# Patient Record
Sex: Female | Born: 2000 | Race: White | Hispanic: No | Marital: Married | Smoking: Never smoker
Health system: Southern US, Community
[De-identification: ages and names within clinical notes are randomized; demographics above are authoritative.]

## PROBLEM LIST (undated history)

## (undated) ENCOUNTER — Ambulatory Visit: Admission: EM | Disposition: A | Discharge status: Home / Self Care | Payer: Commercial Managed Care - PPO

## (undated) DIAGNOSIS — Z789 Other specified health status: Secondary | ICD-10-CM

## (undated) HISTORY — PX: KNEE SURGERY: SHX244

---
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
## 2022-01-05 LAB — OB RESULTS CONSOLE GBS: GBS: NEGATIVE

## 2022-01-06 ENCOUNTER — Encounter (HOSPITAL_COMMUNITY): Payer: Self-pay | Admitting: *Deleted

## 2022-01-06 ENCOUNTER — Telehealth (HOSPITAL_COMMUNITY): Payer: Self-pay | Admitting: *Deleted

## 2022-01-06 NOTE — Telephone Encounter (Signed)
Preadmission screen  

## 2022-01-12 ENCOUNTER — Inpatient Hospital Stay (HOSPITAL_COMMUNITY)
Admission: AD | Admit: 2022-01-12 | Discharge: 2022-01-12 | Disposition: A | Discharge status: Home / Self Care | Payer: BC Managed Care – PPO | Attending: Obstetrics and Gynecology | Admitting: Obstetrics and Gynecology

## 2022-01-12 ENCOUNTER — Encounter (HOSPITAL_COMMUNITY): Payer: Self-pay | Admitting: Obstetrics and Gynecology

## 2022-01-12 DIAGNOSIS — O163 Unspecified maternal hypertension, third trimester: Secondary | ICD-10-CM

## 2022-01-12 DIAGNOSIS — O133 Gestational [pregnancy-induced] hypertension without significant proteinuria, third trimester: Secondary | ICD-10-CM | POA: Diagnosis present

## 2022-01-12 DIAGNOSIS — M7989 Other specified soft tissue disorders: Secondary | ICD-10-CM | POA: Insufficient documentation

## 2022-01-12 DIAGNOSIS — Z3A37 37 weeks gestation of pregnancy: Secondary | ICD-10-CM | POA: Diagnosis not present

## 2022-01-12 LAB — COMPREHENSIVE METABOLIC PANEL
ALT: 20 U/L (ref 0–44)
AST: 20 U/L (ref 15–41)
Albumin: 3 g/dL — ABNORMAL LOW (ref 3.5–5.0)
Alkaline Phosphatase: 111 U/L (ref 38–126)
Anion gap: 8 (ref 5–15)
BUN: 7 mg/dL (ref 6–20)
CO2: 24 mmol/L (ref 22–32)
Calcium: 9.2 mg/dL (ref 8.9–10.3)
Chloride: 104 mmol/L (ref 98–111)
Creatinine, Ser: 0.66 mg/dL (ref 0.44–1.00)
GFR, Estimated: >60 mL/min (ref >60)
Glucose, Bld: 94 mg/dL (ref 70–99)
Potassium: 3.7 mmol/L (ref 3.5–5.1)
Sodium: 136 mmol/L (ref 135–145)
Total Bilirubin: 0.3 mg/dL (ref 0.3–1.2)
Total Protein: 6.5 g/dL (ref 6.5–8.1)

## 2022-01-12 LAB — CBC WITH DIFFERENTIAL/PLATELET
Abs Immature Granulocytes: 0.05 10*3/uL (ref 0.00–0.07)
Basophils Absolute: 0 10*3/uL (ref 0.0–0.1)
Basophils Relative: 0 %
Eosinophils Absolute: 0.1 10*3/uL (ref 0.0–0.5)
Eosinophils Relative: 1 %
HCT: 34.5 % — ABNORMAL LOW (ref 36.0–46.0)
Hemoglobin: 11.8 g/dL — ABNORMAL LOW (ref 12.0–15.0)
Immature Granulocytes: 1 %
Lymphocytes Relative: 21 %
Lymphs Abs: 1.9 10*3/uL (ref 0.7–4.0)
MCH: 30.2 pg (ref 26.0–34.0)
MCHC: 34.2 g/dL (ref 30.0–36.0)
MCV: 88.2 fL (ref 80.0–100.0)
Monocytes Absolute: 0.5 10*3/uL (ref 0.1–1.0)
Monocytes Relative: 5 %
Neutro Abs: 6.4 10*3/uL (ref 1.7–7.7)
Neutrophils Relative %: 72 %
Platelets: 240 10*3/uL (ref 150–400)
RBC: 3.91 MIL/uL (ref 3.87–5.11)
RDW: 12.5 % (ref 11.5–15.5)
WBC: 8.9 10*3/uL (ref 4.0–10.5)
nRBC: 0 % (ref 0.0–0.2)

## 2022-01-12 LAB — PROTEIN / CREATININE RATIO, URINE
Creatinine, Urine: 126.59 mg/dL
Protein Creatinine Ratio: 0.06 mg/mg{Cre} (ref 0.00–0.15)
Total Protein, Urine: 8 mg/dL

## 2022-01-12 MED ORDER — LABETALOL HCL 5 MG/ML IV SOLN: 80.0000 mg | INTRAVENOUS | Status: DC | PRN

## 2022-01-12 MED ORDER — HYDRALAZINE HCL 20 MG/ML IJ SOLN: 10.0000 mg | INTRAMUSCULAR | Status: DC | PRN

## 2022-01-12 MED ORDER — LABETALOL HCL 5 MG/ML IV SOLN: 40.0000 mg | INTRAVENOUS | Status: DC | PRN

## 2022-01-12 MED ORDER — LABETALOL HCL 5 MG/ML IV SOLN: 20.0000 mg | INTRAVENOUS | Status: DC | PRN

## 2022-01-12 NOTE — MAU Provider Note (Signed)
History     CSN: [id number]  Arrival date and time: 01/12/22 1425   Event Date/Time   First Provider Initiated Contact with Patient 01/12/22 1523      Chief Complaint  Patient presents with   Hypertension   Andrea Becker 21 y.o. G1P0000 at 1265w6d presents to MAU following elevated BP's in the office today. BP in the office today 142/92. Pt denies any personal or family hx of hypertension. Pt states that she has had "mild headaches" over the course of the weekend that subsided spontaneously, but denies a headache today.  She endorses increased leg swelling since Sunday that does not resolve with rest and elevation. Denies epigastric pain. SVE in office today per pt was 1cm.  Denies VB, LOF, and contractions. She endorses +FM.  Has elective induction scheduled for 01/20/22.       OB History     Gravida  1   Para  0   Term  0   Preterm  0   AB  0   Living  0      SAB  0   IAB      Ectopic  0   Multiple  0   Live Births  0           Past Medical History:  Diagnosis Date   Medical history non-contributory     Past Surgical History:  Procedure Laterality Date   KNEE SURGERY      History reviewed. No pertinent family history.  Social History   Tobacco Use   Smoking status: Never   Smokeless tobacco: Never  Vaping Use   Vaping Use: Never used  Substance Use Topics   Alcohol use: Never   Drug use: Never    Allergies: No Known Allergies  Medications Prior to Admission  Medication Sig Dispense Refill Last Dose   doxylamine, Sleep, (UNISOM) 25 MG tablet Take 25 mg by mouth at bedtime as needed.      famotidine (PEPCID) 20 MG tablet Take 1 tablet (20 mg total) by mouth 2 (two) times daily. 30 tablet 4    pyridOXINE (VITAMIN B-6) 50 MG tablet Take 50 mg by mouth daily.       Review of Systems  Constitutional:  Negative for activity change, appetite change and fever.  Respiratory:  Negative for apnea and shortness of breath.    Cardiovascular:  Positive for leg swelling. Negative for chest pain.  Gastrointestinal:  Negative for abdominal pain.  Genitourinary:  Negative for vaginal bleeding, vaginal discharge and vaginal pain.  Neurological:  Positive for headaches. Negative for dizziness, weakness and numbness.  Physical Exam   Blood pressure (!) 143/91, pulse 96, temperature 99 F (37.2 C), temperature source Oral, resp. rate 20, height 5\' 5"  (1.651 m), weight 101.9 kg, SpO2 98 %.  Physical Exam Vitals and nursing note reviewed.  Constitutional:      General: She is not in acute distress.    Appearance: Normal appearance.  HENT:     Head: Normocephalic.  Cardiovascular:     Rate and Rhythm: Normal rate.  Pulmonary:     Effort: Pulmonary effort is normal. No respiratory distress.     Breath sounds: Normal breath sounds.  Abdominal:     Tenderness: There is no abdominal tenderness.     Comments: Pregnant   Musculoskeletal:        General: Swelling present. Normal range of motion.     Cervical back: Normal range of motion.  Comments: Bilateral mild pitting edema noted on lower extremities    Skin:    General: Skin is warm and dry.     Capillary Refill: Capillary refill takes less than 2 seconds.  Neurological:     Mental Status: She is alert and oriented to person, place, and time.  Psychiatric:        Mood and Affect: Mood normal.   FHT: 135 moderate variability with accels presents. No decels noted. Category I   MAU Course  Procedures Lab Orders         OB RESULT CONSOLE Group B Strep         Protein / creatinine ratio, urine         Comprehensive metabolic panel         CBC with Differential/Platelet    Prenatal records reviewed, no prior elevated BPs  noted.   Results for orders placed or performed during the hospital encounter of 01/12/22 (from the past 24 hour(s))  Protein / creatinine ratio, urine     Status: None   Collection Time: 01/12/22  2:48 PM  Result Value Ref Range    Creatinine, Urine 126.59 mg/dL   Total Protein, Urine 8 mg/dL   Protein Creatinine Ratio 0.06 0.00 - 0.15 mg/mg[Cre]  Comprehensive metabolic panel     Status: Abnormal   Collection Time: 01/12/22  3:32 PM  Result Value Ref Range   Sodium 136 135 - 145 mmol/L   Potassium 3.7 3.5 - 5.1 mmol/L   Chloride 104 98 - 111 mmol/L   CO2 24 22 - 32 mmol/L   Glucose, Bld 94 70 - 99 mg/dL   BUN 7 6 - 20 mg/dL   Creatinine, Ser 6.81 0.44 - 1.00 mg/dL   Calcium 9.2 8.9 - 15.7 mg/dL   Total Protein 6.5 6.5 - 8.1 g/dL   Albumin 3.0 (L) 3.5 - 5.0 g/dL   AST 20 15 - 41 U/L   ALT 20 0 - 44 U/L   Alkaline Phosphatase 111 38 - 126 U/L   Total Bilirubin 0.3 0.3 - 1.2 mg/dL   GFR, Estimated >26 >20 mL/min   Anion gap 8 5 - 15  CBC with Differential/Platelet     Status: Abnormal   Collection Time: 01/12/22  3:32 PM  Result Value Ref Range   WBC 8.9 4.0 - 10.5 K/uL   RBC 3.91 3.87 - 5.11 MIL/uL   Hemoglobin 11.8 (L) 12.0 - 15.0 g/dL   HCT 35.5 (L) 97.4 - 16.3 %   MCV 88.2 80.0 - 100.0 fL   MCH 30.2 26.0 - 34.0 pg   MCHC 34.2 30.0 - 36.0 g/dL   RDW 84.5 36.4 - 68.0 %   Platelets 240 150 - 400 K/uL   nRBC 0.0 0.0 - 0.2 %   Neutrophils Relative % 72 %   Neutro Abs 6.4 1.7 - 7.7 K/uL   Lymphocytes Relative 21 %   Lymphs Abs 1.9 0.7 - 4.0 K/uL   Monocytes Relative 5 %   Monocytes Absolute 0.5 0.1 - 1.0 K/uL   Eosinophils Relative 1 %   Eosinophils Absolute 0.1 0.0 - 0.5 K/uL   Basophils Relative 0 %   Basophils Absolute 0.0 0.0 - 0.1 K/uL   Immature Granulocytes 1 %   Abs Immature Granulocytes 0.05 0.00 - 0.07 K/uL    Patient Vitals for the past 24 hrs:  BP Temp Temp src Pulse Resp SpO2 Height Weight  01/12/22 1605 -- -- -- -- -- 98 % -- --  01/12/22 1600 127/83 -- -- 92 -- 98 % -- --  01/12/22 1555 -- -- -- -- -- 99 % -- --  01/12/22 1550 -- -- -- -- -- 99 % -- --  01/12/22 1545 (!) 140/94 -- -- (!) 102 -- 99 % -- --  01/12/22 1540 -- -- -- -- -- 99 % -- --  01/12/22 1535 -- -- -- -- -- 99  % -- --  01/12/22 1530 (!) 152/100 -- -- 98 20 99 % -- --  01/12/22 1525 -- -- -- -- -- 98 % -- --  01/12/22 1520 -- -- -- -- -- 98 % -- --  01/12/22 1515 136/86 -- -- 92 -- 99 % -- --  01/12/22 1510 -- -- -- -- -- 99 % -- --  01/12/22 1505 -- -- -- -- -- 98 % -- --  01/12/22 1502 (!) 143/91 -- -- 96 20 98 % -- --  01/12/22 1455 -- -- -- -- -- 98 % -- --  01/12/22 1454 (!) 142/92 -- -- (!) 109 18 100 % -- --  01/12/22 1440 (!) 149/88 99 F (37.2 C) Oral 99 17 100 % 5\' 5"  (1.651 m) 101.9 kg     MDM BPs decreased without intervention. Low Suspicion for Pre Eclampsia with normal labs and no other features. Report given to  Dr. for follow up blood pressure check in the office for Thursday or Friday of this week. Dx: Gestational hypertension    Assessment and Plan  Gestational Hypertension  - Plan for follow up with OB/GYN in the office this week.  - Pt discharged home in stable condition.  FHT Category I  - Routine labor precautions reviewed    Saturday, CNM  01/12/2022, 3:23 PM

## 2022-01-12 NOTE — MAU Note (Signed)
....  Andrea Becker is a 21 y.o. at [redacted]w[redacted]d here in MAU reporting: Two elevated BP's in office today with [facility] so she was sent here to be evaluated. Denies pain. Denies HA, visual disturbances, edema, and RUQ/epigastric pain. No VB or LOF. +FM.  Pain score: Denies pain.  FHT: 132 initial external Lab orders placed from triage: UA

## 2022-01-16 ENCOUNTER — Encounter (HOSPITAL_COMMUNITY): Payer: Self-pay | Admitting: Obstetrics and Gynecology

## 2022-01-16 ENCOUNTER — Other Ambulatory Visit: Payer: Self-pay

## 2022-01-16 ENCOUNTER — Inpatient Hospital Stay (HOSPITAL_COMMUNITY)
Admission: AD | Admit: 2022-01-16 | Discharge: 2022-01-19 | DRG: 807 | Disposition: A | Discharge status: Home / Self Care | Payer: BC Managed Care – PPO | Attending: Obstetrics and Gynecology | Admitting: Obstetrics and Gynecology

## 2022-01-16 DIAGNOSIS — R030 Elevated blood-pressure reading, without diagnosis of hypertension: Secondary | ICD-10-CM | POA: Diagnosis present

## 2022-01-16 DIAGNOSIS — O134 Gestational [pregnancy-induced] hypertension without significant proteinuria, complicating childbirth: Secondary | ICD-10-CM | POA: Diagnosis present

## 2022-01-16 DIAGNOSIS — Z3A38 38 weeks gestation of pregnancy: Secondary | ICD-10-CM | POA: Diagnosis not present

## 2022-01-16 DIAGNOSIS — O133 Gestational [pregnancy-induced] hypertension without significant proteinuria, third trimester: Secondary | ICD-10-CM | POA: Diagnosis not present

## 2022-01-16 DIAGNOSIS — O169 Unspecified maternal hypertension, unspecified trimester: Secondary | ICD-10-CM | POA: Diagnosis present

## 2022-01-16 LAB — CBC
HCT: 34.1 % — ABNORMAL LOW (ref 36.0–46.0)
Hemoglobin: 11.5 g/dL — ABNORMAL LOW (ref 12.0–15.0)
MCH: 29.9 pg (ref 26.0–34.0)
MCHC: 33.7 g/dL (ref 30.0–36.0)
MCV: 88.6 fL (ref 80.0–100.0)
Platelets: 231 10*3/uL (ref 150–400)
RBC: 3.85 MIL/uL — ABNORMAL LOW (ref 3.87–5.11)
RDW: 12.6 % (ref 11.5–15.5)
WBC: 10.6 10*3/uL — ABNORMAL HIGH (ref 4.0–10.5)
nRBC: 0 % (ref 0.0–0.2)

## 2022-01-16 LAB — TYPE AND SCREEN
ABO/RH(D): O POS
Antibody Screen: NEGATIVE

## 2022-01-16 LAB — COMPREHENSIVE METABOLIC PANEL
ALT: 16 U/L (ref 0–44)
AST: 17 U/L (ref 15–41)
Albumin: 2.8 g/dL — ABNORMAL LOW (ref 3.5–5.0)
Alkaline Phosphatase: 102 U/L (ref 38–126)
Anion gap: 8 (ref 5–15)
BUN: 12 mg/dL (ref 6–20)
CO2: 21 mmol/L — ABNORMAL LOW (ref 22–32)
Calcium: 8.8 mg/dL — ABNORMAL LOW (ref 8.9–10.3)
Chloride: 105 mmol/L (ref 98–111)
Creatinine, Ser: 0.63 mg/dL (ref 0.44–1.00)
GFR, Estimated: >60 mL/min (ref >60)
Glucose, Bld: 84 mg/dL (ref 70–99)
Potassium: 3.5 mmol/L (ref 3.5–5.1)
Sodium: 134 mmol/L — ABNORMAL LOW (ref 135–145)
Total Bilirubin: 0.2 mg/dL — ABNORMAL LOW (ref 0.3–1.2)
Total Protein: 6.3 g/dL — ABNORMAL LOW (ref 6.5–8.1)

## 2022-01-16 LAB — PROTEIN / CREATININE RATIO, URINE
Creatinine, Urine: 62.24 mg/dL
Protein Creatinine Ratio: 0.1 mg/mg{Cre} (ref 0.00–0.15)
Total Protein, Urine: 6 mg/dL

## 2022-01-16 LAB — URINALYSIS, ROUTINE W REFLEX MICROSCOPIC
Bilirubin Urine: NEGATIVE
Glucose, UA: NEGATIVE mg/dL
Hgb urine dipstick: NEGATIVE
Ketones, ur: NEGATIVE mg/dL
Nitrite: NEGATIVE
Protein, ur: NEGATIVE mg/dL
Specific Gravity, Urine: 1.011 (ref 1.005–1.030)
pH: 7 (ref 5.0–8.0)

## 2022-01-16 LAB — RPR: RPR Ser Ql: NONREACTIVE

## 2022-01-16 MED ORDER — OXYTOCIN BOLUS FROM INFUSION
333.0000 mL | Freq: Once | INTRAVENOUS | Status: AC
  Administered 2022-01-17: 333 mL via INTRAVENOUS

## 2022-01-16 MED ORDER — OXYTOCIN-SODIUM CHLORIDE 30-0.9 UT/500ML-% IV SOLN
2.5000 [IU]/h | INTRAVENOUS | Status: DC
  Filled 2022-01-16: qty 500

## 2022-01-16 MED ORDER — TERBUTALINE SULFATE 1 MG/ML IJ SOLN
0.2500 mg | Freq: Once | INTRAMUSCULAR | Status: DC | PRN
Start: 2022-01-16 — End: 2022-01-17

## 2022-01-16 MED ORDER — LACTATED RINGERS IV SOLN: INTRAVENOUS | Status: DC

## 2022-01-16 MED ORDER — ONDANSETRON HCL 4 MG/2ML IJ SOLN
4.0000 mg | Freq: Four times a day (QID) | INTRAMUSCULAR | Status: DC | PRN
  Administered 2022-01-17 (×2): 4 mg via INTRAVENOUS
  Filled 2022-01-16 (×2): qty 2

## 2022-01-16 MED ORDER — LACTATED RINGERS IV SOLN: 500.0000 mL | INTRAVENOUS | Status: DC | PRN

## 2022-01-16 MED ORDER — OXYCODONE-ACETAMINOPHEN 5-325 MG PO TABS: 2.0000 | ORAL_TABLET | ORAL | Status: DC | PRN

## 2022-01-16 MED ORDER — BUTORPHANOL TARTRATE 1 MG/ML IJ SOLN: 1.0000 mg | INTRAMUSCULAR | Status: DC | PRN

## 2022-01-16 MED ORDER — HYDROXYZINE HCL 50 MG PO TABS: 50.0000 mg | ORAL_TABLET | Freq: Four times a day (QID) | ORAL | Status: DC | PRN

## 2022-01-16 MED ORDER — OXYCODONE-ACETAMINOPHEN 5-325 MG PO TABS: 1.0000 | ORAL_TABLET | ORAL | Status: DC | PRN

## 2022-01-16 MED ORDER — ACETAMINOPHEN 325 MG PO TABS: 650.0000 mg | ORAL_TABLET | ORAL | Status: DC | PRN

## 2022-01-16 MED ORDER — MISOPROSTOL 50MCG HALF TABLET
50.0000 ug | ORAL_TABLET | ORAL | Status: DC | PRN
  Administered 2022-01-16 (×3): 50 ug via BUCCAL
  Filled 2022-01-16 (×3): qty 1

## 2022-01-16 MED ORDER — SOD CITRATE-CITRIC ACID 500-334 MG/5ML PO SOLN: 30.0000 mL | ORAL | Status: DC | PRN

## 2022-01-16 MED ORDER — LIDOCAINE HCL (PF) 1 % IJ SOLN
30.0000 mL | INTRAMUSCULAR | Status: DC | PRN
Start: 2022-01-16 — End: 2022-01-17

## 2022-01-16 NOTE — MAU Note (Signed)
Pt says she was sent here on Tues from office - drew labs -for BP- then home Today- back to office - BP  high- told to watch this weekend  Tonight at 1030pm - H/A- 6- no meds . Took BP at home- at 0127- 154/94 , 145/95 No visual changes, has had upper abd pain tonight

## 2022-01-16 NOTE — H&P (Signed)
OB History and Physical   Andrea Becker is a 21 y.o. female G1P0 presenting for elevated BP and headache at [redacted]w[redacted]d.  She has had elevated blood pressures recently and meets criteria for gestational hypertension.  Pregnancy has been otherwise uncomplicated.  Rh positive, GBS negative 01/05/22.  Panorama low risk, female.    OB History     Gravida  1   Para  0   Term  0   Preterm  0   AB  0   Living  0      SAB  0   IAB      Ectopic  0   Multiple  0   Live Births  0          Past Medical History:  Diagnosis Date   Medical history non-contributory    Past Surgical History:  Procedure Laterality Date   KNEE SURGERY     Family History: family history is not on file. Social History:  reports that she has never smoked. She has never used smokeless tobacco. She reports that she does not drink alcohol and does not use drugs.     Maternal Diabetes: No Genetic Screening: Normal Maternal Ultrasounds/Referrals: Normal Fetal Ultrasounds or other Referrals:  None Maternal Substance Abuse:  No Significant Maternal Medications:  None Significant Maternal Lab Results:  Group B Strep negative Other Comments:  None  Review of Systems - Patient denies fever, chills, SOB, CP, N/V/D.  History Dilation: 1 Effacement (%): 50 Station: -3 Exam by:: J.Bellamy,RN Blood pressure 132/88, pulse 93, temperature 98.8 F (37.1 C), temperature source Oral, resp. rate 17, height 5\' 5"  (1.651 m), weight 103.5 kg, SpO2 98 %. Exam Physical Exam   Gen: alert, well appearing, no distress Chest: nonlabored breathing CV: no peripheral edema Abdomen: soft, gravid  Ext: no evidence of DVT  Prenatal labs: ABO, Rh: O/Positive/-- (11/02 0000) Antibody: Negative (11/02 0000) Rubella: Immune (11/02 0000) RPR: Nonreactive (11/02 0000)  HBsAg: Negative (11/02 0000)  HIV: Non-reactive (11/02 0000)  GBS: Negative/-- (05/16 0000)   Assessment/Plan: Admit to Labor and Delivery -  delay due to available beds on L&D Induction for gestational hypertension.  Omer labs to be repeated on admission Cytotec for cervical ripening, followed by pitocin, AROM Epidural when desired Anticipate vaginal delivery   Carlyon Shadow 01/16/2022, 4:02 AM

## 2022-01-16 NOTE — Progress Notes (Signed)
Labor Progress Note  Patient doing well s/p 2nd dose of buccal cytotec, feeling more cramping.  Cervix 1.5/50/-3 at last check.  Discussed continued cytotec vs. Cervical foley.  Patient doing well, continue IOL.  Nilda Simmer

## 2022-01-16 NOTE — MAU Provider Note (Signed)
History     [id number]  Arrival date and time: 01/16/22 0241    Chief Complaint  Patient presents with   Hypertension   Headache     HPI Andrea Becker is a 21 y.o. at [redacted]w[redacted]d who presents for hypertension & headache. Seen in the office & MAU this week with high blood pressure. Reports waking up tonight with a headache & BP was high at home. Headache 6/10. Hasn't treated pain. No visual disturbance, or epigastric pain. Denies history of hypertension outside of pregnancy. Denies contractions, leaking of fluid, or vaginal bleeding. Positive fetal movement.    OB History     Gravida  1   Para  0   Term  0   Preterm  0   AB  0   Living  0      SAB  0   IAB      Ectopic  0   Multiple  0   Live Births  0           Past Medical History:  Diagnosis Date   Medical history non-contributory     Past Surgical History:  Procedure Laterality Date   KNEE SURGERY      History reviewed. No pertinent family history.  No Known Allergies  No current facility-administered medications on file prior to encounter.   Current Outpatient Medications on File Prior to Encounter  Medication Sig Dispense Refill   Prenatal Vit-Fe Fumarate-FA (PREPLUS) 27-1 MG TABS Take by mouth.     doxylamine, Sleep, (UNISOM) 25 MG tablet Take 25 mg by mouth at bedtime as needed.     famotidine (PEPCID) 20 MG tablet Take 1 tablet (20 mg total) by mouth 2 (two) times daily. 30 tablet 4   pyridOXINE (VITAMIN B-6) 50 MG tablet Take 50 mg by mouth daily.       ROS Pertinent positives and negative per HPI, all others reviewed and negative  Physical Exam   BP 132/88   Pulse 93   Temp 98.8 F (37.1 C) (Oral)   Resp 17   Ht 5\' 5"  (1.651 m)   Wt 103.5 kg   SpO2 98%   BMI 37.96 kg/m   Patient Vitals for the past 24 hrs:  BP Temp Temp src Pulse Resp SpO2 Height Weight  01/16/22 0345 132/88 -- -- 93 -- 98 % -- --  01/16/22 0340 -- 98.8 F (37.1 C) Oral (!) 107 17 97 % -- --   01/16/22 0321 (!) 141/87 97.9 F (36.6 C) Oral (!) 102 18 -- 5\' 5"  (1.651 m) 103.5 kg    Physical Exam Vitals and nursing note reviewed.  Constitutional:      General: She is not in acute distress.    Appearance: She is well-developed.  HENT:     Head: Normocephalic and atraumatic.  Eyes:     General: No scleral icterus.    Pupils: Pupils are equal, round, and reactive to light.  Pulmonary:     Effort: Pulmonary effort is normal. No respiratory distress.  Abdominal:     Palpations: Abdomen is soft.     Tenderness: There is no abdominal tenderness.     Comments: gravid  Musculoskeletal:     Right lower leg: 1+ Pitting Edema present.     Left lower leg: 1+ Pitting Edema present.  Skin:    General: Skin is warm and dry.  Neurological:     Mental Status: She is alert.     Deep Tendon Reflexes:  Reflex Scores:      Patellar reflexes are 2+ on the right side and 2+ on the left side.    Comments: No clonus     Cervical Exam Dilation: 1 Effacement (%): 50 Station: -3 Presentation: Vertex Exam by:: J.Bellamy,RN   FHT Baseline 140, moderate variability, 10x10 accels, no decels Toco: irregular Cat: 1  Labs Results for orders placed or performed during the hospital encounter of 01/16/22 (from the past 24 hour(s))  Urinalysis, Routine w reflex microscopic Urine, Clean Catch     Status: Abnormal   Collection Time: 01/16/22  3:34 AM  Result Value Ref Range   Color, Urine YELLOW YELLOW   APPearance HAZY (A) CLEAR   Specific Gravity, Urine 1.011 1.005 - 1.030   pH 7.0 5.0 - 8.0   Glucose, UA NEGATIVE NEGATIVE mg/dL   Hgb urine dipstick NEGATIVE NEGATIVE   Bilirubin Urine NEGATIVE NEGATIVE   Ketones, ur NEGATIVE NEGATIVE mg/dL   Protein, ur NEGATIVE NEGATIVE mg/dL   Nitrite NEGATIVE NEGATIVE   Leukocytes,Ua LARGE (A) NEGATIVE   RBC / HPF 0-5 0 - 5 RBC/hpf   WBC, UA 11-20 0 - 5 WBC/hpf   Bacteria, UA RARE (A) NONE SEEN   Squamous Epithelial / LPF 6-10 0 - 5     Imaging No results found.  MAU Course  Procedures Lab Orders         Urinalysis, Routine w reflex microscopic Urine, Clean Catch         CBC         Comprehensive metabolic panel         Protein / creatinine ratio, urine         RPR    No orders of the defined types were placed in this encounter.  Imaging Orders  No imaging studies ordered today    MDM Patient presents with headache & hypertension. Reviewed prenatal records & notes from previous MAU visits. Patient meets criteria for gestational hypertension. Discussed recommendation for admission with Dr. Mardelle Matte who agrees & will place orders.   Assessment and Plan   1. Gestational hypertension, third trimester   2. [redacted] weeks gestation of pregnancy    -Admit to birthing suites for induction   Jorje Guild, NP 01/16/22 3:59 AM

## 2022-01-17 ENCOUNTER — Encounter (HOSPITAL_COMMUNITY): Payer: Self-pay | Admitting: Obstetrics and Gynecology

## 2022-01-17 ENCOUNTER — Inpatient Hospital Stay (HOSPITAL_COMMUNITY): Payer: BC Managed Care – PPO | Admitting: Anesthesiology

## 2022-01-17 MED ORDER — IBUPROFEN 600 MG PO TABS
600.0000 mg | ORAL_TABLET | Freq: Four times a day (QID) | ORAL | Status: DC
  Administered 2022-01-17 – 2022-01-19 (×6): 600 mg via ORAL
  Filled 2022-01-17 (×6): qty 1

## 2022-01-17 MED ORDER — ZOLPIDEM TARTRATE 5 MG PO TABS: 5.0000 mg | ORAL_TABLET | Freq: Every evening | ORAL | Status: DC | PRN

## 2022-01-17 MED ORDER — TERBUTALINE SULFATE 1 MG/ML IJ SOLN: 0.2500 mg | Freq: Once | INTRAMUSCULAR | Status: DC | PRN

## 2022-01-17 MED ORDER — EPHEDRINE 5 MG/ML INJ: 10.0000 mg | INTRAVENOUS | Status: DC | PRN

## 2022-01-17 MED ORDER — PHENYLEPHRINE 80 MCG/ML (10ML) SYRINGE FOR IV PUSH (FOR BLOOD PRESSURE SUPPORT)
80.0000 ug | PREFILLED_SYRINGE | INTRAVENOUS | Status: DC | PRN
  Filled 2022-01-17: qty 10

## 2022-01-17 MED ORDER — COCONUT OIL OIL: 1.0000 "application " | TOPICAL_OIL | Status: DC | PRN

## 2022-01-17 MED ORDER — SIMETHICONE 80 MG PO CHEW: 80.0000 mg | CHEWABLE_TABLET | ORAL | Status: DC | PRN

## 2022-01-17 MED ORDER — LIDOCAINE-EPINEPHRINE (PF) 2 %-1:200000 IJ SOLN
INTRAMUSCULAR | Status: DC | PRN
Start: 2022-01-17 — End: 2022-01-17
  Administered 2022-01-17: 5 mL via EPIDURAL

## 2022-01-17 MED ORDER — ACETAMINOPHEN 325 MG PO TABS: 650.0000 mg | ORAL_TABLET | ORAL | Status: DC | PRN

## 2022-01-17 MED ORDER — ONDANSETRON HCL 4 MG PO TABS: 4.0000 mg | ORAL_TABLET | ORAL | Status: DC | PRN

## 2022-01-17 MED ORDER — OXYTOCIN-SODIUM CHLORIDE 30-0.9 UT/500ML-% IV SOLN
1.0000 m[IU]/min | INTRAVENOUS | Status: DC
  Administered 2022-01-17: 2 m[IU]/min via INTRAVENOUS

## 2022-01-17 MED ORDER — DIPHENHYDRAMINE HCL 25 MG PO CAPS: 25.0000 mg | ORAL_CAPSULE | Freq: Four times a day (QID) | ORAL | Status: DC | PRN

## 2022-01-17 MED ORDER — PHENYLEPHRINE 80 MCG/ML (10ML) SYRINGE FOR IV PUSH (FOR BLOOD PRESSURE SUPPORT): 80.0000 ug | PREFILLED_SYRINGE | INTRAVENOUS | Status: DC | PRN

## 2022-01-17 MED ORDER — BENZOCAINE-MENTHOL 20-0.5 % EX AERO
1.0000 "application " | INHALATION_SPRAY | CUTANEOUS | Status: DC | PRN
  Administered 2022-01-18: 1 via TOPICAL
  Filled 2022-01-17: qty 56

## 2022-01-17 MED ORDER — DIBUCAINE (PERIANAL) 1 % EX OINT: 1.0000 "application " | TOPICAL_OINTMENT | CUTANEOUS | Status: DC | PRN

## 2022-01-17 MED ORDER — PRENATAL MULTIVITAMIN CH
1.0000 | ORAL_TABLET | Freq: Every day | ORAL | Status: DC
  Administered 2022-01-18: 1 via ORAL
  Filled 2022-01-17: qty 1

## 2022-01-17 MED ORDER — LACTATED RINGERS IV SOLN: 500.0000 mL | Freq: Once | INTRAVENOUS | Status: DC

## 2022-01-17 MED ORDER — FAMOTIDINE 20 MG PO TABS
20.0000 mg | ORAL_TABLET | Freq: Two times a day (BID) | ORAL | Status: DC
  Administered 2022-01-19: 20 mg via ORAL
  Filled 2022-01-17 (×2): qty 1

## 2022-01-17 MED ORDER — TETANUS-DIPHTH-ACELL PERTUSSIS 5-2.5-18.5 LF-MCG/0.5 IM SUSY: 0.5000 mL | PREFILLED_SYRINGE | Freq: Once | INTRAMUSCULAR | Status: DC

## 2022-01-17 MED ORDER — WITCH HAZEL-GLYCERIN EX PADS: 1.0000 "application " | MEDICATED_PAD | CUTANEOUS | Status: DC | PRN

## 2022-01-17 MED ORDER — SENNOSIDES-DOCUSATE SODIUM 8.6-50 MG PO TABS
2.0000 | ORAL_TABLET | ORAL | Status: DC
  Administered 2022-01-18 – 2022-01-19 (×2): 2 via ORAL
  Filled 2022-01-17 (×2): qty 2

## 2022-01-17 MED ORDER — FENTANYL-BUPIVACAINE-NACL 0.5-0.125-0.9 MG/250ML-% EP SOLN
12.0000 mL/h | EPIDURAL | Status: DC | PRN
  Administered 2022-01-17: 12 mL/h via EPIDURAL
  Filled 2022-01-17: qty 250

## 2022-01-17 MED ORDER — ONDANSETRON HCL 4 MG/2ML IJ SOLN: 4.0000 mg | INTRAMUSCULAR | Status: DC | PRN

## 2022-01-17 MED ORDER — DIPHENHYDRAMINE HCL 50 MG/ML IJ SOLN: 12.5000 mg | INTRAMUSCULAR | Status: DC | PRN

## 2022-01-17 NOTE — Progress Notes (Signed)
Labor Progress Note  Patient doing well, feeling more cramping s/p 3rd dose of cytotec.  Cervix remains a tight 2/60/-2, foley balloon placed without difficulty.  Will titrate pitocin 2x2, hold at 10 while foley is in place.  FHT cat 1  Continue current management.   Alpha Gula

## 2022-01-17 NOTE — Plan of Care (Signed)
Problem: Education: Goal: Knowledge of General Education information will improve Description: Including pain rating scale, medication(s)/side effects and non-pharmacologic comfort measures 01/17/2022 2120 by Joretta Bachelor, RN Outcome: Progressing 01/17/2022 2120 by Joretta Bachelor, RN Outcome: Progressing   Problem: Health Behavior/Discharge Planning: Goal: Ability to manage health-related needs will improve 01/17/2022 2120 by Joretta Bachelor, RN Outcome: Progressing 01/17/2022 2120 by Joretta Bachelor, RN Outcome: Progressing   Problem: Clinical Measurements: Goal: Ability to maintain clinical measurements within normal limits will improve 01/17/2022 2120 by Joretta Bachelor, RN Outcome: Progressing 01/17/2022 2120 by Joretta Bachelor, RN Outcome: Progressing Goal: Will remain free from infection 01/17/2022 2120 by Joretta Bachelor, RN Outcome: Progressing 01/17/2022 2120 by Joretta Bachelor, RN Outcome: Progressing Goal: Diagnostic test results will improve 01/17/2022 2120 by Joretta Bachelor, RN Outcome: Progressing 01/17/2022 2120 by Joretta Bachelor, RN Outcome: Progressing Goal: Respiratory complications will improve 01/17/2022 2120 by Joretta Bachelor, RN Outcome: Progressing 01/17/2022 2120 by Joretta Bachelor, RN Outcome: Progressing Goal: Cardiovascular complication will be avoided 01/17/2022 2120 by Joretta Bachelor, RN Outcome: Progressing 01/17/2022 2120 by Joretta Bachelor, RN Outcome: Progressing   Problem: Activity: Goal: Risk for activity intolerance will decrease 01/17/2022 2120 by Joretta Bachelor, RN Outcome: Progressing 01/17/2022 2120 by Joretta Bachelor, RN Outcome: Progressing   Problem: Nutrition: Goal: Adequate nutrition will be maintained 01/17/2022 2120 by Joretta Bachelor, RN Outcome: Progressing 01/17/2022 2120 by Joretta Bachelor, RN Outcome: Progressing   Problem: Coping: Goal: Level of anxiety will decrease 01/17/2022 2120 by Joretta Bachelor, RN Outcome: Progressing 01/17/2022 2120 by Joretta Bachelor, RN Outcome: Progressing   Problem: Elimination: Goal: Will not experience complications related to bowel motility 01/17/2022 2120 by Joretta Bachelor, RN Outcome: Progressing 01/17/2022 2120 by Joretta Bachelor, RN Outcome: Progressing Goal: Will not experience complications related to urinary retention 01/17/2022 2120 by Joretta Bachelor, RN Outcome: Progressing 01/17/2022 2120 by Joretta Bachelor, RN Outcome: Progressing   Problem: Pain Managment: Goal: General experience of comfort will improve 01/17/2022 2120 by Joretta Bachelor, RN Outcome: Progressing 01/17/2022 2120 by Joretta Bachelor, RN Outcome: Progressing   Problem: Safety: Goal: Ability to remain free from injury will improve 01/17/2022 2120 by Joretta Bachelor, RN Outcome: Progressing 01/17/2022 2120 by Joretta Bachelor, RN Outcome: Progressing   Problem: Skin Integrity: Goal: Risk for impaired skin integrity will decrease 01/17/2022 2120 by Joretta Bachelor, RN Outcome: Progressing 01/17/2022 2120 by Joretta Bachelor, RN Outcome: Progressing   Problem: Education: Goal: Knowledge of Childbirth will improve 01/17/2022 2120 by Joretta Bachelor, RN Outcome: Progressing 01/17/2022 2120 by Joretta Bachelor, RN Outcome: Progressing Goal: Ability to make informed decisions regarding treatment and plan of care will improve 01/17/2022 2120 by Joretta Bachelor, RN Outcome: Progressing 01/17/2022 2120 by Joretta Bachelor, RN Outcome: Progressing Goal: Ability to state and carry out methods to decrease the pain will improve 01/17/2022 2120 by Joretta Bachelor, RN Outcome: Progressing 01/17/2022 2120 by Joretta Bachelor, RN Outcome: Progressing Goal: Individualized Educational Video(s) 01/17/2022 2120 by Joretta Bachelor, RN Outcome: Progressing 01/17/2022 2120 by Joretta Bachelor, RN Outcome: Progressing   Problem: Coping: Goal: Ability to verbalize  concerns and feelings about labor and delivery will improve 01/17/2022 2120 by Joretta Bachelor, RN Outcome: Progressing 01/17/2022 2120 by Joretta Bachelor, RN Outcome: Progressing   Problem: Life Cycle: Goal: Ability to make normal progression through stages of  labor will improve 01/17/2022 2120 by Joretta Bachelor, RN Outcome: Progressing 01/17/2022 2120 by Joretta Bachelor, RN Outcome: Progressing Goal: Ability to effectively push during vaginal delivery will improve 01/17/2022 2120 by Joretta Bachelor, RN Outcome: Progressing 01/17/2022 2120 by Joretta Bachelor, RN Outcome: Progressing   Problem: Role Relationship: Goal: Will demonstrate positive interactions with the child 01/17/2022 2120 by Joretta Bachelor, RN Outcome: Progressing 01/17/2022 2120 by Joretta Bachelor, RN Outcome: Progressing   Problem: Safety: Goal: Risk of complications during labor and delivery will decrease 01/17/2022 2120 by Joretta Bachelor, RN Outcome: Progressing 01/17/2022 2120 by Joretta Bachelor, RN Outcome: Progressing   Problem: Pain Management: Goal: Relief or control of pain from uterine contractions will improve 01/17/2022 2120 by Joretta Bachelor, RN Outcome: Progressing 01/17/2022 2120 by Joretta Bachelor, RN Outcome: Progressing   Problem: Education: Goal: Knowledge of condition will improve Outcome: Progressing Goal: Individualized Educational Video(s) Outcome: Progressing Goal: Individualized Newborn Educational Video(s) Outcome: Progressing   Problem: Activity: Goal: Will verbalize the importance of balancing activity with adequate rest periods Outcome: Progressing Goal: Ability to tolerate increased activity will improve Outcome: Progressing   Problem: Coping: Goal: Ability to identify and utilize available resources and services will improve Outcome: Progressing   Problem: Life Cycle: Goal: Chance of risk for complications during the postpartum period will decrease Outcome:  Progressing   Problem: Role Relationship: Goal: Ability to demonstrate positive interaction with newborn will improve Outcome: Progressing   Problem: Skin Integrity: Goal: Demonstration of wound healing without infection will improve Outcome: Progressing

## 2022-01-17 NOTE — Anesthesia Preprocedure Evaluation (Signed)
Anesthesia Evaluation  °Patient identified by MRN, date of birth, ID band °Patient awake ° ° ° °Reviewed: °Allergy & Precautions, NPO status , Patient's Chart, lab work & pertinent test results ° °Airway °Mallampati: II ° °TM Distance: >3 FB °Neck ROM: Full ° ° ° Dental °no notable dental hx. ° °  °Pulmonary °neg pulmonary ROS,  °  °Pulmonary exam normal °breath sounds clear to auscultation ° ° ° ° ° ° Cardiovascular °hypertension (gHTN), Normal cardiovascular exam °Rhythm:Regular Rate:Normal ° ° °  °Neuro/Psych °negative neurological ROS ° negative psych ROS  ° GI/Hepatic °negative GI ROS, Neg liver ROS,   °Endo/Other  °negative endocrine ROS ° Renal/GU °negative Renal ROS  °negative genitourinary °  °Musculoskeletal °negative musculoskeletal ROS °(+)  ° Abdominal °  °Peds ° Hematology °negative hematology ROS °(+)   °Anesthesia Other Findings °IOL for gHTN ° Reproductive/Obstetrics °(+) Pregnancy ° °  ° ° ° ° ° ° ° ° ° ° ° ° ° °  °  ° ° ° ° ° ° ° ° °Anesthesia Physical °Anesthesia Plan ° °ASA: 3 ° °Anesthesia Plan: Epidural  ° °Post-op Pain Management:   ° °Induction:  ° °PONV Risk Score and Plan: Treatment may vary due to age or medical condition ° °Airway Management Planned: Natural Airway ° °Additional Equipment:  ° °Intra-op Plan:  ° °Post-operative Plan:  ° °Informed Consent: I have reviewed the patients History and Physical, chart, labs and discussed the procedure including the risks, benefits and alternatives for the proposed anesthesia with the patient or authorized representative who has indicated his/her understanding and acceptance.  ° ° ° ° ° °Plan Discussed with: Anesthesiologist ° °Anesthesia Plan Comments: (Patient identified. Risks, benefits, options discussed with patient including but not limited to bleeding, infection, nerve damage, paralysis, failed block, incomplete pain control, headache, blood pressure changes, nausea, vomiting, reactions to medication,  itching, and post partum back pain. Confirmed with bedside nurse the patient's most recent platelet count. Confirmed with the patient that they are not taking any anticoagulation, have any bleeding history or any family history of bleeding disorders. Patient expressed understanding and wishes to proceed. All questions were answered. )  ° ° ° ° ° ° °Anesthesia Quick Evaluation ° °

## 2022-01-17 NOTE — Anesthesia Procedure Notes (Signed)
Epidural Patient location during procedure: OB Start time: 01/17/2022 8:25 AM End time: 01/17/2022 8:35 AM  Staffing Anesthesiologist: Elmer Picker, MD Performed: anesthesiologist   Preanesthetic Checklist Completed: patient identified, IV checked, risks and benefits discussed, monitors and equipment checked, pre-op evaluation and timeout performed  Epidural Patient position: sitting Prep: DuraPrep and site prepped and draped Patient monitoring: continuous pulse ox, blood pressure, heart rate and cardiac monitor Approach: midline Location: L3-L4 Injection technique: LOR air  Needle:  Needle type: Tuohy  Needle gauge: 17 G Needle length: 9 cm Needle insertion depth: 7.5 cm Catheter type: closed end flexible Catheter size: 19 Gauge Catheter at skin depth: 14 cm Test dose: negative  Assessment Sensory level: T8 Events: blood not aspirated, injection not painful, no injection resistance, no paresthesia and negative IV test  Additional Notes Patient identified. Risks/Benefits/Options discussed with patient including but not limited to bleeding, infection, nerve damage, paralysis, failed block, incomplete pain control, headache, blood pressure changes, nausea, vomiting, reactions to medication both or allergic, itching and postpartum back pain. Confirmed with bedside nurse the patient's most recent platelet count. Confirmed with patient that they are not currently taking any anticoagulation, have any bleeding history or any family history of bleeding disorders. Patient expressed understanding and wished to proceed. All questions were answered. Sterile technique was used throughout the entire procedure. Please see nursing notes for vital signs. Test dose was given through epidural catheter and negative prior to continuing to dose epidural or start infusion. Warning signs of high block given to the patient including shortness of breath, tingling/numbness in hands, complete motor block,  or any concerning symptoms with instructions to call for help. Patient was given instructions on fall risk and not to get out of bed. All questions and concerns addressed with instructions to call with any issues or inadequate analgesia.  Reason for block:procedure for pain

## 2022-01-17 NOTE — Progress Notes (Signed)
Labor Progress Note  Foley has come out and pitocin titrated to 8 mU/min.  Epidural placed and working well.  Cervix 4-5 cm / 60 / -2.  Head well applied. AROM performed in typical fashion with return for clear fluid.  Continue to titrate pit as able.  FHT cat 1  All questions answered  Nilda Simmer

## 2022-01-17 NOTE — Lactation Note (Signed)
This note was copied from a baby's chart. Lactation Consultation Note  Patient Name: Andrea Becker [id number] Date: 01/17/2022 Reason for consult: L&D Initial assessment Age:19 hours Per RN, infant has shoulder dystocia on right arm. Mom attempted latch infant on her right breast using the cradle hold, infant was not interested in BF at this time.  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 Mom was taught hand expression with breast model, mom self expressed and infant was given 5 mls of colostrum by spoon. Mom will continue to work on latching infant at breast and will ask RN/LC for further latch assistance on MBU. Mom understands to flange infant's bottom lip outward when latching infant at the breast. Mom will continue to BF infant according to primal cues, on demand, 8 to 12+ or more times within 24 hours. Mom was doing skin to skin with infant when [facility] left the room. LC congratulated parents on the birth of their son.   Maternal Data Has patient been taught Hand Expression?: Yes  Feeding Mother's Current Feeding Choice: Breast Milk  LATCH Score Latch: Too sleepy or reluctant, no latch achieved, no sucking elicited.  Audible Swallowing: None  Type of Nipple: Everted at rest and after stimulation  Comfort (Breast/Nipple): Soft / non-tender  Hold (Positioning): Assistance needed to correctly position infant at breast and maintain latch.  LATCH Score: 5   Lactation Tools Discussed/Used    Interventions Interventions: Assisted with latch;Skin to skin;Breast compression;Adjust position;Support pillows;Position options;Expressed milk;Education  Discharge    Consult Status Consult Status: Follow-up from L&D    Danelle Earthly 01/17/2022, 7:18 PM

## 2022-01-17 NOTE — Plan of Care (Signed)

## 2022-01-17 NOTE — Progress Notes (Signed)
Labor Progress Note  Patient with new pressure.   FHT with spontaneous decel after large accel lasting 1-2 min.  Cervix with significant interval change to 8-9 cm / 100 / 0.  FHT reactive and reassuring without further decels.  Reassurance given.  Continue current mgmt.  Alpha Gula MD

## 2022-01-18 LAB — CBC
HCT: 31.4 % — ABNORMAL LOW (ref 36.0–46.0)
Hemoglobin: 10.4 g/dL — ABNORMAL LOW (ref 12.0–15.0)
MCH: 29.4 pg (ref 26.0–34.0)
MCHC: 33.1 g/dL (ref 30.0–36.0)
MCV: 88.7 fL (ref 80.0–100.0)
Platelets: 203 10*3/uL (ref 150–400)
RBC: 3.54 MIL/uL — ABNORMAL LOW (ref 3.87–5.11)
RDW: 12.5 % (ref 11.5–15.5)
WBC: 15 10*3/uL — ABNORMAL HIGH (ref 4.0–10.5)
nRBC: 0 % (ref 0.0–0.2)

## 2022-01-18 NOTE — Anesthesia Postprocedure Evaluation (Signed)
Anesthesia Post Note  Patient: Andrea Becker  Procedure(s) Performed: AN AD [place]     Patient location during evaluation: Mother Baby Anesthesia Type: Epidural Level of consciousness: awake and alert Pain management: pain level controlled Vital Signs Assessment: post-procedure vital signs reviewed and stable Respiratory status: spontaneous breathing, nonlabored ventilation and respiratory function stable Cardiovascular status: stable Postop Assessment: no headache, no backache and epidural receding Anesthetic complications: no   No notable events documented.  Last Vitals:  Vitals:   01/18/22 0243 01/18/22 0516  BP: 118/79 124/84  Pulse: 79 79  Resp: 20 18  Temp: 36.6 C 36.5 C  SpO2: 98% 100%    Last Pain:  Vitals:   01/18/22 0516  TempSrc: Oral  PainSc:    Pain Goal: Patients Stated Pain Goal: 2 (01/16/22 1730)                 Gilmer Mor

## 2022-01-18 NOTE — Lactation Note (Signed)
This note was copied from a baby's chart. Lactation Consultation Note  Patient Name: Andrea Becker [id number] Date: 01/18/2022 Reason for consult: Initial assessment;Early term 37-38.6wks;Primapara;1st time breastfeeding Age:19 hours   P1 mother whose infant is now 73 hours old.  This is an early term infant at 16+4 weeks.  Mother's current feeding preference is breast and formula.  Reviewed breast feeding basics.  Mother demonstrated hand expression; unable to express drops at this time.  Encouraged to continue lots of STS, breast massage and hand expression.    Attempted to latch in two different positions: cross cradle and football hold.  Baby "Hall Busing" not interested in opening his mouth for latching.  Reassurance given.  Discussed the possibility of cluster feeding tonight.  Placed him STS on mother's chest and he fell asleep.  RN updated.   Maternal Data Has patient been taught Hand Expression?: Yes Does the patient have breastfeeding experience prior to this delivery?: No  Feeding Mother's Current Feeding Choice: Breast Milk and Formula Nipple Type: Slow - flow  LATCH Score Latch: Too sleepy or reluctant, no latch achieved, no sucking elicited.  Audible Swallowing: None  Type of Nipple: Everted at rest and after stimulation  Comfort (Breast/Nipple): Soft / non-tender  Hold (Positioning): Assistance needed to correctly position infant at breast and maintain latch.  LATCH Score: 5   Lactation Tools Discussed/Used    Interventions Interventions: Breast feeding basics reviewed;Assisted with latch;Skin to skin;Breast massage;Hand express;Breast compression;Position options;Support pillows;Adjust position;Education;LC Services brochure  Discharge Pump: Personal (Has ordered breast pump) WIC Program: No  Consult Status Consult Status: Follow-up Date: 01/19/22 Follow-up type: In-patient    Kaidon Kinker R Brok Stocking 01/18/2022, 6:07 PM

## 2022-01-18 NOTE — Progress Notes (Signed)
I called Dr. Lorane Gell at 2148 for slightly elevated pressures of 134/93 and 137/81. Will continue to check vitals per protocol and MD is okay with only calling for blood pressures 160/110 or higher. Patient does not have a headache at this time.

## 2022-01-18 NOTE — Progress Notes (Signed)
Postpartum Progress Note  Post Partum Day 1 s/p spontaneous vaginal delivery.  Patient reports well-controlled pain, ambulating without difficulty, voiding spontaneously, tolerating PO.  Vaginal bleeding is appropriate.   Objective: Blood pressure 124/84, pulse 79, temperature 97.7 F (36.5 C), temperature source Oral, resp. rate 18, height 5\' 5"  (1.651 m), weight 103.5 kg, SpO2 100 %, unknown if currently breastfeeding.  Physical Exam:  General: alert and no distress Lochia: appropriate Uterine Fundus: firm DVT Evaluation: No evidence of DVT seen on physical exam.  Recent Labs    01/16/22 0516 01/18/22 0436  HGB 11.5* 10.4*  HCT 34.1* 31.4*    Assessment/Plan: Postpartum Day 1, s/p vaginal delivery yesterday 1808. Continue routine postpartum care Baby boy - desires circ. Will perform when cleared.  Lactation following Anticipate discharge home tomorrow   LOS: 2 days   01/20/22 01/18/2022, 8:05 AM

## 2022-01-19 NOTE — Plan of Care (Signed)
Education complete.

## 2022-01-19 NOTE — Discharge Summary (Signed)
Postpartum Discharge Summary       Patient Name: Andrea Becker DOB: [birth year] MRN: [id number]  Date of admission: 01/16/2022 Delivery date:01/17/2022  Delivering provider: Eyvonne Mechanic A  Date of discharge: 01/19/2022  Admitting diagnosis: Hypertension in pregnancy [O16.9] Intrauterine pregnancy: [redacted]w[redacted]d    Secondary diagnosis:  Principal Problem:   Hypertension in pregnancy  Additional problems:      Discharge diagnosis: Term Pregnancy Delivered and Gestational Hypertension                                              Post partum procedures:   Augmentation: AROM, Pitocin, and Cytotec Complications: None  Hospital course: Induction of Labor With Vaginal Delivery   21y.o. yo G1P1001 at 368w4das admitted to the hospital 01/16/2022 for induction of labor.  Indication for induction: Gestational hypertension.  Patient had an uncomplicated labor course as follows: Membrane Rupture Time/Date: 9:12 AM ,01/17/2022   Delivery Method:Vaginal, Spontaneous  Episiotomy: None  Lacerations:  2nd degree  Details of delivery can be found in separate delivery note.  Patient had a routine postpartum course. Patient is discharged home 01/19/22.  Newborn Data: Birth date:01/17/2022  Birth time:6:08 PM  Gender:Female  Living status:Living  Apgars:4 ,  [id number]:[id number]   Magnesium Sulfate received: No BMZ received: No Rhophylac:N/A MMR:N/A T-DaP:Given prenatally Flu: N/A Transfusion:No  Physical exam  Vitals:   01/18/22 1612 01/18/22 2031 01/18/22 2136 01/19/22 0523  BP: 133/81 (!) 134/93 137/81 122/63  Pulse: 78 78 83 63  Resp: _0 Temp: 98.1 F (36.7 C) 98 F (36.7 C)  97.7 F (36.5 C)  TempSrc: Oral Oral  Oral  SpO2: 99% 100%  100%  Weight:      Height:       General: alert, cooperative, and no distress Lochia: appropriate Uterine Fundus: firm Incision: N/A DVT Evaluation: No evidence of DVT seen on physical exam. Labs: Lab Results  Component Value Date    WBC 15.0 (H) 01/18/2022   HGB 10.4 (L) 01/18/2022   HCT 31.4 (L) 01/18/2022   MCV 88.7 01/18/2022   PLT 203 01/18/2022      Latest Ref Rng & Units 01/16/2022    5:16 AM  CMP  Glucose 70 - 99 mg/dL 84    BUN 6 - 20 mg/dL 12    Creatinine 0.44 - 1.00 mg/dL 0.63    Sodium 135 - 145 mmol/L 134    Potassium 3.5 - 5.1 mmol/L 3.5    Chloride 98 - 111 mmol/L 105    CO2 22 - 32 mmol/L 21    Calcium 8.9 - 10.3 mg/dL 8.8    Total Protein 6.5 - 8.1 g/dL 6.3    Total Bilirubin 0.3 - 1.2 mg/dL 0.2    Alkaline Phos 38 - 126 U/L 102    AST 15 - 41 U/L 17    ALT 0 - 44 U/L 16     Edinburgh Score:    01/17/2022   10:55 PM  Edinburgh Postnatal Depression Scale Screening Tool  I have been able to laugh and see the funny side of things. 0  I have looked forward with enjoyment to things. 0  I have blamed myself unnecessarily when things went wrong. 0  I have been anxious or worried for no good reason. 0  I have felt scared  or panicky for no good reason. 0  Things have been getting on top of me. 0  I have been so unhappy that I have had difficulty sleeping. 0  I have felt sad or miserable. 0  I have been so unhappy that I have been crying. 1  The thought of harming myself has occurred to me. 0  Edinburgh Postnatal Depression Scale Total 1     After visit meds:  Allergies as of 01/19/2022   No Known Allergies      Medication List     TAKE these medications    doxylamine (Sleep) 25 MG tablet Commonly known as: UNISOM Take 25 mg by mouth at bedtime as needed.   famotidine 20 MG tablet Commonly known as: Pepcid Take 1 tablet (20 mg total) by mouth 2 (two) times daily.   PrePLUS 27-1 MG Tabs Take by mouth.   pyridOXINE 50 MG tablet Commonly known as: VITAMIN B-6 Take 50 mg by mouth daily.         Discharge home in stable condition Infant Feeding: Breast Infant Disposition:home with mother Discharge instruction: per After Visit Summary and Postpartum  booklet. Activity: Advance as tolerated. Pelvic rest for 6 weeks.  Diet: routine diet Future Appointments:No future appointments. Follow up Visit:   Please schedule this patient for a In person postpartum visit in 6 weeks with the following provider: MD. Additional Postpartum F/U:     High risk pregnancy complicated by: HTN Delivery mode:  Vaginal, Spontaneous  Anticipated Birth Control:       01/19/2022 David C Lowe, MD    

## 2022-01-20 ENCOUNTER — Inpatient Hospital Stay (HOSPITAL_COMMUNITY): Payer: BC Managed Care – PPO

## 2022-01-20 ENCOUNTER — Inpatient Hospital Stay (HOSPITAL_COMMUNITY)
Admission: RE | Admit: 2022-01-20 | Payer: BC Managed Care – PPO | Source: Home / Self Care | Admitting: Obstetrics and Gynecology

## 2022-01-26 ENCOUNTER — Telehealth (HOSPITAL_COMMUNITY): Payer: Self-pay | Admitting: *Deleted

## 2022-01-26 NOTE — Telephone Encounter (Signed)
Mom reports feeling good. No concerns about herself at this time. EPDS=0 [facility] score=1) Mom reports baby is doing well. Feeding, peeing, and pooping without difficulty. Safe sleep reviewed. Mom reports no concerns about baby at present.  Duffy Rhody, RN 01-26-2022 at 2:49p

## 2022-02-16 ENCOUNTER — Emergency Department: Payer: BC Managed Care – PPO

## 2022-02-16 ENCOUNTER — Emergency Department
Admission: EM | Admit: 2022-02-16 | Discharge: 2022-02-16 | Disposition: A | Discharge status: Home / Self Care | Payer: BC Managed Care – PPO | Attending: Emergency Medicine | Admitting: Emergency Medicine

## 2022-02-16 ENCOUNTER — Other Ambulatory Visit: Payer: Self-pay

## 2022-02-16 ENCOUNTER — Other Ambulatory Visit
Admission: RE | Admit: 2022-02-16 | Discharge: 2022-02-16 | Disposition: A | Discharge status: Home / Self Care | Payer: BC Managed Care – PPO | Source: Ambulatory Visit | Attending: *Deleted | Admitting: *Deleted

## 2022-02-16 DIAGNOSIS — O139 Gestational [pregnancy-induced] hypertension without significant proteinuria, unspecified trimester: Secondary | ICD-10-CM | POA: Diagnosis not present

## 2022-02-16 DIAGNOSIS — B9689 Other specified bacterial agents as the cause of diseases classified elsewhere: Secondary | ICD-10-CM | POA: Diagnosis not present

## 2022-02-16 DIAGNOSIS — R0781 Pleurodynia: Secondary | ICD-10-CM | POA: Insufficient documentation

## 2022-02-16 DIAGNOSIS — O8622 Infection of bladder following delivery: Secondary | ICD-10-CM | POA: Insufficient documentation

## 2022-02-16 DIAGNOSIS — O9089 Other complications of the puerperium, not elsewhere classified: Secondary | ICD-10-CM | POA: Diagnosis present

## 2022-02-16 DIAGNOSIS — N3000 Acute cystitis without hematuria: Secondary | ICD-10-CM | POA: Diagnosis not present

## 2022-02-16 DIAGNOSIS — R519 Headache, unspecified: Secondary | ICD-10-CM | POA: Diagnosis not present

## 2022-02-16 LAB — URINALYSIS, ROUTINE W REFLEX MICROSCOPIC
Bilirubin Urine: NEGATIVE
Glucose, UA: NEGATIVE mg/dL
Hgb urine dipstick: NEGATIVE
Ketones, ur: NEGATIVE mg/dL
Nitrite: NEGATIVE
Protein, ur: NEGATIVE mg/dL
Specific Gravity, Urine: 1.02 (ref 1.005–1.030)
pH: 5 (ref 5.0–8.0)

## 2022-02-16 LAB — CBC WITH DIFFERENTIAL/PLATELET
Abs Immature Granulocytes: 0.01 10*3/uL (ref 0.00–0.07)
Basophils Absolute: 0 10*3/uL (ref 0.0–0.1)
Basophils Relative: 1 %
Eosinophils Absolute: 0.1 10*3/uL (ref 0.0–0.5)
Eosinophils Relative: 2 %
HCT: 39.9 % (ref 36.0–46.0)
Hemoglobin: 13.2 g/dL (ref 12.0–15.0)
Immature Granulocytes: 0 %
Lymphocytes Relative: 31 %
Lymphs Abs: 1.3 10*3/uL (ref 0.7–4.0)
MCH: 28.9 pg (ref 26.0–34.0)
MCHC: 33.1 g/dL (ref 30.0–36.0)
MCV: 87.3 fL (ref 80.0–100.0)
Monocytes Absolute: 0.4 10*3/uL (ref 0.1–1.0)
Monocytes Relative: 8 %
Neutro Abs: 2.4 10*3/uL (ref 1.7–7.7)
Neutrophils Relative %: 58 %
Platelets: 229 10*3/uL (ref 150–400)
RBC: 4.57 MIL/uL (ref 3.87–5.11)
RDW: 11.9 % (ref 11.5–15.5)
WBC: 4.2 10*3/uL (ref 4.0–10.5)
nRBC: 0 % (ref 0.0–0.2)

## 2022-02-16 LAB — PROTEIN / CREATININE RATIO, URINE
Creatinine, Urine: 264 mg/dL
Protein Creatinine Ratio: 0.03 mg/mg{Cre} (ref 0.00–0.15)
Total Protein, Urine: 9 mg/dL

## 2022-02-16 LAB — COMPREHENSIVE METABOLIC PANEL
ALT: 71 U/L — ABNORMAL HIGH (ref 0–44)
AST: 41 U/L (ref 15–41)
Albumin: 4.1 g/dL (ref 3.5–5.0)
Alkaline Phosphatase: 75 U/L (ref 38–126)
Anion gap: 10 (ref 5–15)
BUN: 8 mg/dL (ref 6–20)
CO2: 26 mmol/L (ref 22–32)
Calcium: 9.4 mg/dL (ref 8.9–10.3)
Chloride: 104 mmol/L (ref 98–111)
Creatinine, Ser: 0.64 mg/dL (ref 0.44–1.00)
GFR, Estimated: >60 mL/min (ref >60)
Glucose, Bld: 89 mg/dL (ref 70–99)
Potassium: 3.9 mmol/L (ref 3.5–5.1)
Sodium: 140 mmol/L (ref 135–145)
Total Bilirubin: 0.8 mg/dL (ref 0.3–1.2)
Total Protein: 7.9 g/dL (ref 6.5–8.1)

## 2022-02-16 LAB — D-DIMER, QUANTITATIVE: D-Dimer, Quant: 0.58 ug/mL-FEU — ABNORMAL HIGH (ref 0.00–0.50)

## 2022-02-16 MED ORDER — NIFEDIPINE ER OSMOTIC RELEASE 30 MG PO TB24
30.0000 mg | ORAL_TABLET | Freq: Every day | ORAL | Status: DC
  Administered 2022-02-16: 30 mg via ORAL
  Filled 2022-02-16: qty 1

## 2022-02-16 MED ORDER — CEPHALEXIN 500 MG PO CAPS: 500.0000 mg | ORAL_CAPSULE | Freq: Three times a day (TID) | ORAL | 0 refills | Status: AC

## 2022-02-16 MED ORDER — NIFEDIPINE ER OSMOTIC RELEASE 30 MG PO TB24: 30.0000 mg | ORAL_TABLET | Freq: Every day | ORAL | 0 refills | Status: AC

## 2022-02-16 MED ORDER — IOHEXOL 350 MG/ML SOLN
75.0000 mL | Freq: Once | INTRAVENOUS | Status: AC | PRN
  Administered 2022-02-16: 75 mL via INTRAVENOUS

## 2022-02-16 MED ORDER — SODIUM CHLORIDE 0.9 % IV BOLUS
1000.0000 mL | Freq: Once | INTRAVENOUS | Status: AC
  Administered 2022-02-16: 1000 mL via INTRAVENOUS

## 2022-02-16 MED ORDER — DIPHENHYDRAMINE HCL 50 MG/ML IJ SOLN
25.0000 mg | Freq: Once | INTRAMUSCULAR | Status: AC
  Administered 2022-02-16: 25 mg via INTRAVENOUS
  Filled 2022-02-16: qty 1

## 2022-02-16 MED ORDER — PROCHLORPERAZINE EDISYLATE 10 MG/2ML IJ SOLN
10.0000 mg | Freq: Once | INTRAMUSCULAR | Status: AC
  Administered 2022-02-16: 10 mg via INTRAVENOUS
  Filled 2022-02-16: qty 2

## 2022-02-16 NOTE — Consult Note (Signed)
Consult History and Physical   SERVICE: Obstetrics and Gynecology  Patient Name: Andrea Becker Patient MRN:   [id number]  CC: headache, body aches, 4 weeks postpartum, elevated BP  HPI: Andrea Becker is a 21 y.o. G1P1001 with a headache that has come and gone since June 18 but is typically daily and comes 2-3x/day. She states Tylenol relieves the headache but has been less effective the last few days. She denies any changes of vision. She gave birth 01/17/2022 at [facility] after being induced for gHTN. She was not placed on magnesium sulfate during her hospital stay. She was not discharged on any antihypertensive medications and has not followed-up with her OB/GYN since discharge. She states has a follow-up postpartum appointment scheduled with her OB/GYN on July 3. She is currently breastfeeding and bottlefeeding.   Review of Systems: positives in bold GEN:   fevers, chills, weight changes, appetite changes, fatigue, night sweats HEENT:  HA, vision changes, hearing loss, congestion, rhinorrhea, sinus pressure, dysphagia CV:   CP, palpitations PULM:  SOB, cough GI:  abd pain, N/V/D/C GU:  dysuria, urgency, frequency MSK:  arthralgias, myalgias, back pain, swelling SKIN:  rashes, color changes, pallor NEURO:  numbness, weakness, tingling, seizures, dizziness, tremors PSYCH:  depression, anxiety, behavioral problems, confusion  HEME/LYMPH:  easy bruising or bleeding ENDO:  heat/cold intolerance  Past Obstetrical History: OB History     Gravida  1   Para  1   Term  1   Preterm  0   AB  0   Living  1      SAB  0   IAB      Ectopic  0   Multiple  0   Live Births  1           Past Gynecologic History: No LMP recorded.   Past Medical History: Past Medical History:  Diagnosis Date   Medical history non-contributory     Past Surgical History:   Past Surgical History:  Procedure Laterality Date   KNEE SURGERY  2019    Family History:   family history includes Cancer in her mother; Diabetes in her paternal grandfather; Hypertension in her paternal grandfather.  Social History:  Social History   Socioeconomic History   Marital status: Married    Spouse name: Einar Gip   Number of children: Not on file   Years of education: Not on file   Highest education level: High school graduate  Occupational History   Occupation: unemployed  Tobacco Use   Smoking status: Never   Smokeless tobacco: Never  Vaping Use   Vaping Use: Never used  Substance and Sexual Activity   Alcohol use: Never   Drug use: Never   Sexual activity: Yes  Other Topics Concern   Not on file  Social History Narrative   Not on file   Social Determinants of Health   Financial Resource Strain: Not on file  Food Insecurity: Not on file  Transportation Needs: Not on file  Physical Activity: Not on file  Stress: Not on file  Social Connections: Not on file  Intimate Partner Violence: Not on file    Home Medications:  Medications reconciled in EPIC  No current facility-administered medications on file prior to encounter.   Current Outpatient Medications on File Prior to Encounter  Medication Sig Dispense Refill   doxylamine, Sleep, (UNISOM) 25 MG tablet Take 25 mg by mouth at bedtime as needed. (Patient not taking: Reported on 02/16/2022)  famotidine (PEPCID) 20 MG tablet Take 1 tablet (20 mg total) by mouth 2 (two) times daily. (Patient not taking: Reported on 02/16/2022) 30 tablet 4   Prenatal Vit-Fe Fumarate-FA (PREPLUS) 27-1 MG TABS Take by mouth.     pyridOXINE (VITAMIN B-6) 50 MG tablet Take 50 mg by mouth daily. (Patient not taking: Reported on 02/16/2022)      Allergies:  No Known Allergies  Physical Exam:  Temp:  [98.1 F (36.7 C)] 98.1 F (36.7 C) (06/27 1201) Pulse Rate:  [76-95] 90 (06/27 1324) Resp:  [16-19] 16 (06/27 1324) BP: (139-146)/(94-98) 146/98 (06/27 1324) SpO2:  [99 %-100 %] 100 % (06/27  1324) Weight:  [103.5 kg] 103.5 kg (06/27 1235)   General Appearance:  Well developed, well nourished, no acute distress, alert and oriented x3 HEENT:  Normocephalic atraumatic, extraocular movements intact, moist mucous membranes Cardiovascular:  Normal S1/S2, regular rate and rhythm, no murmurs Pulmonary:  clear to auscultation, no wheezes, rales or rhonchi, symmetric air entry, good air exchange Abdomen:  Bowel sounds present, soft, nontender, nondistended, no abnormal masses, no epigastric pain Extremities:  Full range of motion, no pedal edema, 2+ distal pulses, no tenderness, DTR +2 Skin:  normal coloration and turgor, no rashes, no suspicious skin lesions noted  Neurologic:  Cranial nerves 2-12 grossly intact, normal muscle tone, strength 5/5 all four extremities Psychiatric:  Normal mood and affect, appropriate, no AH/VH Pelvic:  deferred  Vitals:   02/16/22 1201 02/16/22 1321 02/16/22 1324  BP: (!) 139/94 (!) 146/98 (!) 146/98     Labs/Studies:   CBC and Coags:  Lab Results  Component Value Date   WBC 4.2 02/16/2022   NEUTOPHILPCT 58 02/16/2022   EOSPCT 2 02/16/2022   BASOPCT 1 02/16/2022   LYMPHOPCT 31 02/16/2022   HGB 13.2 02/16/2022   HCT 39.9 02/16/2022   MCV 87.3 02/16/2022   PLT 229 02/16/2022   CMP:  Lab Results  Component Value Date   NA 140 02/16/2022   K 3.9 02/16/2022   CL 104 02/16/2022   CO2 26 02/16/2022   BUN 8 02/16/2022   CREATININE 0.64 02/16/2022   CREATININE 0.63 01/16/2022   CREATININE 0.66 01/12/2022   PROT 7.9 02/16/2022   BILITOT 0.8 02/16/2022   ALT 71 (H) 02/16/2022   AST 41 02/16/2022   ALKPHOS 75 02/16/2022   Other Labs: See above  TVUS:  n/a Other Imaging: CT HEAD WO CONTRAST ( )  Result Date: 02/16/2022 CLINICAL DATA:  Headache 4 weeks postpartum EXAM: CT HEAD WITHOUT CONTRAST TECHNIQUE: Contiguous axial images were obtained from the base of the skull through the vertex without intravenous contrast. RADIATION DOSE  REDUCTION: This exam was performed according to the departmental dose-optimization program which includes automated exposure control, adjustment of the mA and/or kV according to patient size and/or use of iterative reconstruction technique. COMPARISON:  None Available. FINDINGS: Brain: There is no acute intracranial hemorrhage, extra-axial fluid collection, or acute infarct. Parenchymal volume is normal. The ventricles are normal in size. Gray-white differentiation is preserved. There is no mass lesion.  There is no mass effect or midline shift. Vascular: Increased density in the venous system is favored to reflect residual contrast from the CTA chest performed earlier the same day. Skull: Normal. Negative for fracture or focal lesion. Sinuses/Orbits: The imaged paranasal sinuses are clear. The globes and orbits are unremarkable. Other: None. IMPRESSION: Normal head CT. Electronically Signed   By: Lesia Hausen M.D.   On: 02/16/2022 14:53   CT Angio  Chest PE W and/or Wo Contrast  Result Date: 02/16/2022 CLINICAL DATA:  Upper abdominal pain, positive D-dimer, 4 weeks postpartum EXAM: CT ANGIOGRAPHY CHEST WITH CONTRAST TECHNIQUE: Multidetector CT imaging of the chest was performed using the standard protocol during bolus administration of intravenous contrast. Multiplanar CT image reconstructions and MIPs were obtained to evaluate the vascular anatomy. RADIATION DOSE REDUCTION: This exam was performed according to the departmental dose-optimization program which includes automated exposure control, adjustment of the mA and/or kV according to patient size and/or use of iterative reconstruction technique. CONTRAST:  13mL OMNIPAQUE IOHEXOL 350 MG/ML SOLN COMPARISON:  Chest radiograph done on 06/15/2008 FINDINGS: Cardiovascular: There is homogeneous enhancement in thoracic aorta. There are no intraluminal filling defects in the pulmonary artery branches. Evaluation of small subsegmental peripheral branches in the lower  lung fields is limited by motion artifacts. Mediastinum/Nodes: Soft tissue density in the anterior mediastinum may suggest thymus. No significant lymphadenopathy is seen. Lungs/Pleura: There are no focal infiltrates. There is no pleural effusion or pneumothorax. Upper Abdomen: There is fatty infiltration in the liver. Spleen is enlarged measuring 14 cm in AP diameter. Musculoskeletal: No acute findings are seen. Review of the MIP images confirms the above findings. IMPRESSION: There is no evidence of thoracic aortic dissection. There is no evidence of pulmonary artery embolism. There is no focal pulmonary consolidation. Fatty liver.  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 Electronically Signed   By: Ernie Avena M.D.   On: 02/16/2022 13:52     Assessment / Plan:   Andrea Becker is a 21 y.o. G1P1001 who presents with headache 4 weeks postpartum.  1. Her BP is 135-145/90s. Start Procardia 30mg  XL daily, will send 14 day prescription of Procardia 30mg  XL. Keep appointment with OB/GYN on July 3 and have them review your blood pressure to determine the need to continue Procardia or change dosage. Reviewed normal blood pressure values. If blood pressure is low or she develops dizziness after taking Procardia, stop Procardia. 2. No s/s pre-eclampsia at this time, no need for magnesium 3. Reviewed warning signs of postpartum pre-eclampsia and advised to return if these symptoms develop.  Thank you for the opportunity to be involved with this pt's care.   , CNM 02/16/2022 3:28 PM

## 2022-02-16 NOTE — ED Triage Notes (Signed)
Pt comes with c/o upper belly pain. Pt states she is 4 weeks postpartum. Pt states this started few weeks ago. Pt states some lower back pain as well. Pt states pain when she inhale as well.   Pt from [facility] with elevated ALT and D-Dimer+ per them.

## 2022-02-17 LAB — URINE CULTURE: Culture: NO GROWTH

## 2023-01-11 ENCOUNTER — Emergency Department: Payer: Commercial Managed Care - PPO

## 2023-01-11 ENCOUNTER — Emergency Department
Admission: EM | Admit: 2023-01-11 | Discharge: 2023-01-12 | Disposition: A | Discharge status: Home / Self Care | Payer: PRIVATE HEALTH INSURANCE | Attending: Emergency Medicine | Admitting: Emergency Medicine

## 2023-01-11 ENCOUNTER — Other Ambulatory Visit: Payer: Self-pay

## 2023-01-11 DIAGNOSIS — M545 Low back pain, unspecified: Secondary | ICD-10-CM | POA: Diagnosis present

## 2023-01-11 DIAGNOSIS — T148XXA Other injury of unspecified body region, initial encounter: Secondary | ICD-10-CM

## 2023-01-11 LAB — POC URINE PREG, ED: Preg Test, Ur: NEGATIVE — NL

## 2023-01-11 NOTE — ED Triage Notes (Signed)
Pt arrives via POV c/o lower back pain 10/10, lower lumbar, upper sacral area. Pt states that while helping a pt getting dressed bent over to help pt get foot in undergarment pt back popped in the lower area with very sharp pain. Pt states that the pain remains and is constant even when not moving. Pt states that she took ibu 400mg  at approx 2015

## 2023-01-11 NOTE — ED Provider Notes (Signed)
[facility] Provider Note  Patient Contact: 9:56 PM (approximate)   History   Back Pain   HPI  Andrea Becker is a 22 y.o. female who presents the department complaining of midline low back pain that suddenly began while she was here at work.  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  She did not feel herself, did not lose bowel or bladder control, no saddle anesthesia.  No radicular symptoms at this time.  She states that she has pulsating midline low back pain.     Physical Exam   Triage Vital Signs: ED Triage Vitals  Enc Vitals Group     BP      Pulse      Resp      Temp      Temp src      SpO2      Weight      Height      Head Circumference      Peak Flow      Pain Score      Pain Loc      Pain Edu?      Excl. in GC?     Most recent vital signs: Vitals:   01/11/23 2158  BP: (!) 137/93  Pulse: 91  Resp: 16  Temp: 97.9 F (36.6 C)  SpO2: 99%     General: Alert and in no acute distress.   Cardiovascular:  Good peripheral perfusion Respiratory: Normal respiratory effort without tachypnea or retractions. Lungs CTAB.  Gastrointestinal: Bowel sounds 4 quadrants. Soft and nontender to palpation. No guarding or rigidity. No palpable masses. No distention. No CVA tenderness. Musculoskeletal: Full range of motion to all extremities.  Patient has midline tenderness over the L3-L4 region of her vertebrae without palpable abnormality or step-off.  Tenderness extends into the bilateral paraspinal muscle groups.  No extension into the SI joints or sciatic notches.  Pulse and sensation intact and equal bilateral lower extremities. Neurologic:  No gross focal neurologic deficits are appreciated.  Skin:   No rash noted Other:   ED Results / Procedures / Treatments   Labs (all labs ordered are listed, but  only abnormal results are displayed) Labs Reviewed  POC URINE PREG, ED     EKG     RADIOLOGY    No results found.  PROCEDURES:  Critical Care performed: No  Procedures   MEDICATIONS ORDERED IN ED: Medications - No data to display   IMPRESSION / MDM / ASSESSMENT AND PLAN / ED COURSE  I reviewed the triage vital signs and the nursing notes.                                 Differential diagnosis includes, but is not limited to, herniated disc, bulging disc, lumbar strain, compression fracture   Patient's presentation is most consistent with acute presentation with potential threat to life or bodily function.   Patient presented to the emergency department with sharp lower back pain after an injury while at work.  Patient was bending over to help patient rest when she felt a sudden sharp pain in her back.  This is primarily over L3-L4.  Pain is sharp at this time.  No concerning neurodeficits were reported.  Exam was overall reassuring with  the exception of tenderness.  This time concern for herniated disc versus lumbar strain.  Patient's imaging is still pending.  She has not been able to provide urine at this time for pregnancy test.  At shift change handoff will be given to Dr. Delton Prairie with final diagnosis and disposition.  Based off of patient's presentation I suspect she will be able to go home with symptom control medications but again final diagnosis and disposition will be provided by attending provider.    Note:  This document was prepared using Dragon voice recognition software and may include unintentional dictation errors.   Racheal Patches, PA-C 01/11/23 2344    Corena Herter, MD 01/11/23 2351

## 2023-01-11 NOTE — ED Notes (Signed)
Patient transported to CT 

## 2023-01-11 NOTE — ED Notes (Signed)
Per provider no UA. Urine sent to lab

## 2023-01-12 MED ORDER — ACETAMINOPHEN 500 MG PO TABS
1000.0000 mg | ORAL_TABLET | Freq: Once | ORAL | Status: AC
  Administered 2023-01-12: 1000 mg via ORAL
  Filled 2023-01-12: qty 2

## 2023-01-12 MED ORDER — IBUPROFEN 400 MG PO TABS
400.0000 mg | ORAL_TABLET | Freq: Once | ORAL | Status: AC
  Administered 2023-01-12: 400 mg via ORAL
  Filled 2023-01-12: qty 1

## 2023-01-12 NOTE — ED Provider Notes (Signed)
Patient signed out to me pending CT results.  CT lumbar spine is negative for injury.  Patient has no neurologic symptoms no indication for MRI at this time.  Discussed likely back sprain recommended supportive care with Tylenol Motrin and rest.  Patient is appropriate for discharge at this time.   Georga Hacking, MD 01/12/23 [phone]

## 2023-01-12 NOTE — Discharge Instructions (Signed)
The CAT scan of your back did not show any obvious ruptured disc or fracture.  Take Tylenol Motrin for pain.  If you are having ongoing pain you need to follow-up with your primary doctor and you may need physical therapy.  Please rest and avoid heavy lifting or activities that make your back feel worse but make sure you are still staying mobile.

## 2023-09-30 ENCOUNTER — Ambulatory Visit
Admission: EM | Admit: 2023-09-30 | Discharge: 2023-09-30 | Discharge status: Against Medical Advice | Payer: Commercial Managed Care - PPO

## 2023-09-30 DIAGNOSIS — J069 Acute upper respiratory infection, unspecified: Secondary | ICD-10-CM | POA: Diagnosis not present

## 2023-10-18 DIAGNOSIS — Z113 Encounter for screening for infections with a predominantly sexual mode of transmission: Secondary | ICD-10-CM | POA: Diagnosis not present

## 2023-10-18 DIAGNOSIS — Z6834 Body mass index (BMI) 34.0-34.9, adult: Secondary | ICD-10-CM | POA: Diagnosis not present

## 2023-10-18 DIAGNOSIS — N939 Abnormal uterine and vaginal bleeding, unspecified: Secondary | ICD-10-CM | POA: Diagnosis not present

## 2023-10-18 DIAGNOSIS — Z01419 Encounter for gynecological examination (general) (routine) without abnormal findings: Secondary | ICD-10-CM | POA: Diagnosis not present

## 2023-10-18 DIAGNOSIS — Z309 Encounter for contraceptive management, unspecified: Secondary | ICD-10-CM | POA: Diagnosis not present

## 2023-10-18 DIAGNOSIS — Z124 Encounter for screening for malignant neoplasm of cervix: Secondary | ICD-10-CM | POA: Diagnosis not present

## 2023-11-21 DIAGNOSIS — E0590 Thyrotoxicosis, unspecified without thyrotoxic crisis or storm: Secondary | ICD-10-CM | POA: Diagnosis not present

## 2024-01-17 DIAGNOSIS — E0590 Thyrotoxicosis, unspecified without thyrotoxic crisis or storm: Secondary | ICD-10-CM | POA: Diagnosis not present

## 2024-01-23 DIAGNOSIS — E0590 Thyrotoxicosis, unspecified without thyrotoxic crisis or storm: Secondary | ICD-10-CM | POA: Diagnosis not present

## 2024-04-19 DIAGNOSIS — E0590 Thyrotoxicosis, unspecified without thyrotoxic crisis or storm: Secondary | ICD-10-CM | POA: Diagnosis not present

## 2024-04-26 DIAGNOSIS — E069 Thyroiditis, unspecified: Secondary | ICD-10-CM | POA: Diagnosis not present

## 2024-06-17 DIAGNOSIS — R112 Nausea with vomiting, unspecified: Secondary | ICD-10-CM | POA: Diagnosis not present

## 2024-06-17 DIAGNOSIS — R1010 Upper abdominal pain, unspecified: Secondary | ICD-10-CM | POA: Diagnosis not present

## 2024-06-17 DIAGNOSIS — R1085 Abdominal pain of multiple sites: Secondary | ICD-10-CM | POA: Diagnosis not present

## 2024-06-17 DIAGNOSIS — R1013 Epigastric pain: Secondary | ICD-10-CM | POA: Diagnosis not present

## 2024-06-17 DIAGNOSIS — R1031 Right lower quadrant pain: Secondary | ICD-10-CM | POA: Diagnosis not present

## 2024-07-23 DIAGNOSIS — L739 Follicular disorder, unspecified: Secondary | ICD-10-CM | POA: Diagnosis not present

## 2024-07-23 DIAGNOSIS — L0292 Furuncle, unspecified: Secondary | ICD-10-CM | POA: Diagnosis not present
